# Patient Record
Sex: Female | Born: 1955 | Race: Black or African American | Hispanic: No | Smoking: Current every day smoker
Health system: Southern US, Community
[De-identification: ages and names within clinical notes are randomized; demographics above are authoritative.]

## PROBLEM LIST (undated history)

## (undated) DIAGNOSIS — K219 Gastro-esophageal reflux disease without esophagitis: Secondary | ICD-10-CM

## (undated) DIAGNOSIS — I10 Essential (primary) hypertension: Secondary | ICD-10-CM

## (undated) DIAGNOSIS — B1920 Unspecified viral hepatitis C without hepatic coma: Secondary | ICD-10-CM

## (undated) DIAGNOSIS — F419 Anxiety disorder, unspecified: Secondary | ICD-10-CM

## (undated) HISTORY — DX: Anxiety disorder, unspecified: F41.9

## (undated) HISTORY — DX: Gastro-esophageal reflux disease without esophagitis: K21.9

## (undated) HISTORY — DX: Unspecified viral hepatitis C without hepatic coma: B19.20

## (undated) HISTORY — PX: BREAST SURGERY: SHX581

## (undated) HISTORY — DX: Essential (primary) hypertension: I10

## (undated) HISTORY — PX: BREAST EXCISIONAL BIOPSY: SUR124

---

## 1999-09-30 ENCOUNTER — Ambulatory Visit (HOSPITAL_COMMUNITY): Admission: RE | Admit: 1999-09-30 | Discharge: 1999-09-30 | Payer: Self-pay | Admitting: *Deleted

## 1999-10-07 ENCOUNTER — Encounter: Admission: RE | Admit: 1999-10-07 | Discharge: 1999-10-07 | Payer: Self-pay | Admitting: *Deleted

## 2002-07-16 ENCOUNTER — Inpatient Hospital Stay (HOSPITAL_COMMUNITY): Admission: AC | Admit: 2002-07-16 | Discharge: 2002-07-16 | Payer: Self-pay

## 2004-06-11 ENCOUNTER — Emergency Department (HOSPITAL_COMMUNITY): Admission: EM | Admit: 2004-06-11 | Discharge: 2004-06-11 | Payer: Self-pay | Admitting: Emergency Medicine

## 2005-03-20 ENCOUNTER — Ambulatory Visit: Payer: Self-pay | Admitting: *Deleted

## 2005-03-20 ENCOUNTER — Ambulatory Visit (HOSPITAL_COMMUNITY): Admission: RE | Admit: 2005-03-20 | Discharge: 2005-03-20 | Payer: Self-pay | Admitting: Family Medicine

## 2005-03-20 ENCOUNTER — Ambulatory Visit: Payer: Self-pay | Admitting: Family Medicine

## 2005-04-02 ENCOUNTER — Ambulatory Visit (HOSPITAL_COMMUNITY): Admission: RE | Admit: 2005-04-02 | Discharge: 2005-04-02 | Payer: Self-pay | Admitting: Family Medicine

## 2005-04-09 ENCOUNTER — Ambulatory Visit: Payer: Self-pay | Admitting: Family Medicine

## 2006-05-14 ENCOUNTER — Ambulatory Visit: Payer: Self-pay | Admitting: Family Medicine

## 2006-10-16 ENCOUNTER — Encounter (INDEPENDENT_AMBULATORY_CARE_PROVIDER_SITE_OTHER): Payer: Self-pay | Admitting: Family Medicine

## 2006-10-16 DIAGNOSIS — F17200 Nicotine dependence, unspecified, uncomplicated: Secondary | ICD-10-CM | POA: Insufficient documentation

## 2008-04-17 ENCOUNTER — Ambulatory Visit: Payer: Self-pay | Admitting: Internal Medicine
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
## 2010-12-09 ENCOUNTER — Inpatient Hospital Stay (INDEPENDENT_AMBULATORY_CARE_PROVIDER_SITE_OTHER)
Admission: RE | Admit: 2010-12-09 | Discharge: 2010-12-09 | Disposition: A | Discharge status: Home / Self Care | Payer: Medicare Other | Source: Ambulatory Visit | Attending: Emergency Medicine | Admitting: Emergency Medicine

## 2010-12-09 DIAGNOSIS — H6020 Malignant otitis externa, unspecified ear: Secondary | ICD-10-CM

## 2011-09-19 ENCOUNTER — Other Ambulatory Visit (HOSPITAL_COMMUNITY): Payer: Self-pay | Admitting: Family Medicine

## 2011-09-19 DIAGNOSIS — Z1231 Encounter for screening mammogram for malignant neoplasm of breast: Secondary | ICD-10-CM

## 2011-10-02 ENCOUNTER — Ambulatory Visit (HOSPITAL_COMMUNITY)
Admission: RE | Admit: 2011-10-02 | Discharge: 2011-10-02 | Disposition: A | Discharge status: Home / Self Care | Payer: PRIVATE HEALTH INSURANCE | Source: Ambulatory Visit | Attending: Family Medicine | Admitting: Family Medicine

## 2011-10-02 DIAGNOSIS — Z1231 Encounter for screening mammogram for malignant neoplasm of breast: Secondary | ICD-10-CM | POA: Insufficient documentation

## 2013-02-16 ENCOUNTER — Other Ambulatory Visit (HOSPITAL_COMMUNITY): Payer: Self-pay | Admitting: Family Medicine

## 2013-02-16 DIAGNOSIS — Z1231 Encounter for screening mammogram for malignant neoplasm of breast: Secondary | ICD-10-CM

## 2013-03-01 ENCOUNTER — Ambulatory Visit (HOSPITAL_COMMUNITY)
Admission: RE | Admit: 2013-03-01 | Discharge: 2013-03-01 | Disposition: A | Discharge status: Home / Self Care | Payer: PRIVATE HEALTH INSURANCE | Source: Ambulatory Visit | Attending: Family Medicine | Admitting: Family Medicine

## 2013-03-01 DIAGNOSIS — Z1231 Encounter for screening mammogram for malignant neoplasm of breast: Secondary | ICD-10-CM

## 2014-01-23 ENCOUNTER — Other Ambulatory Visit (HOSPITAL_COMMUNITY): Payer: Self-pay | Admitting: Family Medicine

## 2014-01-23 DIAGNOSIS — Z1231 Encounter for screening mammogram for malignant neoplasm of breast: Secondary | ICD-10-CM

## 2014-03-01 DIAGNOSIS — I10 Essential (primary) hypertension: Secondary | ICD-10-CM | POA: Diagnosis not present

## 2014-03-02 ENCOUNTER — Ambulatory Visit (HOSPITAL_COMMUNITY)
Admission: RE | Admit: 2014-03-02 | Discharge: 2014-03-02 | Disposition: A | Discharge status: Home / Self Care | Payer: Medicare Other | Source: Ambulatory Visit | Attending: Family Medicine | Admitting: Family Medicine

## 2014-03-02 DIAGNOSIS — Z1231 Encounter for screening mammogram for malignant neoplasm of breast: Secondary | ICD-10-CM | POA: Diagnosis not present

## 2014-05-31 DIAGNOSIS — I10 Essential (primary) hypertension: Secondary | ICD-10-CM | POA: Diagnosis not present

## 2014-07-03 DIAGNOSIS — B170 Acute delta-(super) infection of hepatitis B carrier: Secondary | ICD-10-CM | POA: Diagnosis not present

## 2014-07-03 DIAGNOSIS — I10 Essential (primary) hypertension: Secondary | ICD-10-CM | POA: Diagnosis not present

## 2014-07-06 ENCOUNTER — Telehealth: Payer: Self-pay | Admitting: *Deleted

## 2014-07-06 NOTE — Telephone Encounter (Signed)
Left message for patient to call the clinic. She needs to be scheduled for Hep C labs. Myrtis Hopping

## 2014-07-18 ENCOUNTER — Other Ambulatory Visit: Payer: Medicare Other

## 2014-07-18 DIAGNOSIS — B182 Chronic viral hepatitis C: Secondary | ICD-10-CM | POA: Diagnosis not present

## 2014-07-18 LAB — CBC WITH DIFFERENTIAL/PLATELET
BASOS ABS: 0 10*3/uL (ref 0.0–0.1)
Basophils Relative: 1 % (ref 0–1)
EOS PCT: 3 % (ref 0–5)
Eosinophils Absolute: 0.1 10*3/uL (ref 0.0–0.7)
HCT: 45.5 % (ref 36.0–46.0)
Hemoglobin: 15.3 g/dL — ABNORMAL HIGH (ref 12.0–15.0)
Lymphocytes Relative: 43 % (ref 12–46)
Lymphs Abs: 1.8 10*3/uL (ref 0.7–4.0)
MCH: 28.1 pg (ref 26.0–34.0)
MCHC: 33.6 g/dL (ref 30.0–36.0)
MCV: 83.5 fL (ref 78.0–100.0)
MPV: 9.9 fL (ref 8.6–12.4)
Monocytes Absolute: 0.3 10*3/uL (ref 0.1–1.0)
Monocytes Relative: 6 % (ref 3–12)
NEUTROS ABS: 2 10*3/uL (ref 1.7–7.7)
Neutrophils Relative %: 47 % (ref 43–77)
Platelets: 208 10*3/uL (ref 150–400)
RBC: 5.45 MIL/uL — AB (ref 3.87–5.11)
RDW: 14 % (ref 11.5–15.5)
WBC: 4.2 10*3/uL (ref 4.0–10.5)

## 2014-07-18 LAB — COMPREHENSIVE METABOLIC PANEL
ALBUMIN: 3.8 g/dL (ref 3.5–5.2)
ALK PHOS: 89 U/L (ref 39–117)
ALT: 36 U/L — AB (ref 0–35)
AST: 30 U/L (ref 0–37)
BILIRUBIN TOTAL: 0.5 mg/dL (ref 0.2–1.2)
BUN: 18 mg/dL (ref 6–23)
CALCIUM: 9.4 mg/dL (ref 8.4–10.5)
CHLORIDE: 103 meq/L (ref 96–112)
CO2: 30 mEq/L (ref 19–32)
CREATININE: 0.67 mg/dL (ref 0.50–1.10)
Glucose, Bld: 95 mg/dL (ref 70–99)
POTASSIUM: 4.1 meq/L (ref 3.5–5.3)
SODIUM: 139 meq/L (ref 135–145)
Total Protein: 7 g/dL (ref 6.0–8.3)

## 2014-07-18 LAB — IRON: Iron: 122 ug/dL (ref 42–145)

## 2014-07-19 LAB — PROTIME-INR
INR: 1.01 (ref <1.50)
Prothrombin Time: 13.3 seconds (ref 11.6–15.2)

## 2014-07-19 LAB — HEPATITIS B SURFACE ANTIBODY,QUALITATIVE: HEP B S AB: NEGATIVE

## 2014-07-19 LAB — HEPATITIS A ANTIBODY, TOTAL: Hep A Total Ab: NONREACTIVE

## 2014-07-19 LAB — ANA: ANA: NEGATIVE

## 2014-07-19 LAB — HEPATITIS B CORE ANTIBODY, TOTAL: Hep B Core Total Ab: NONREACTIVE

## 2014-07-19 LAB — HIV ANTIBODY (ROUTINE TESTING W REFLEX): HIV 1&2 Ab, 4th Generation: NONREACTIVE

## 2014-07-19 LAB — HEPATITIS C RNA QUANTITATIVE
HCV Quantitative Log: 6.13 {Log} — ABNORMAL HIGH (ref <1.18)
HCV Quantitative: 1335396 IU/mL — ABNORMAL HIGH (ref <15)

## 2014-07-19 LAB — HEPATITIS B SURFACE ANTIGEN: Hepatitis B Surface Ag: NEGATIVE

## 2014-07-21 LAB — HEPATITIS C GENOTYPE

## 2014-07-26 ENCOUNTER — Telehealth: Payer: Self-pay | Admitting: *Deleted

## 2014-07-26 NOTE — Telephone Encounter (Signed)
Patient called asking if she was going to be scheduled for follow up after her labs. RN advised that Kennyth Lose or Maudie Mercury would be contacting her soon for a Hep C appointment.  Patient confirmed her home number, updated her cell number.  It is ok to leave a message. Landis Gandy, RN

## 2014-08-23 ENCOUNTER — Ambulatory Visit (INDEPENDENT_AMBULATORY_CARE_PROVIDER_SITE_OTHER): Payer: Medicare Other | Admitting: Internal Medicine

## 2014-08-23 ENCOUNTER — Encounter: Payer: Self-pay | Admitting: Internal Medicine

## 2014-08-23 VITALS — BP 145/89 | HR 62 | Temp 98.1°F | Ht 67.0 in | Wt 161.0 lb

## 2014-08-23 DIAGNOSIS — B182 Chronic viral hepatitis C: Secondary | ICD-10-CM | POA: Diagnosis not present

## 2014-08-23 DIAGNOSIS — Z23 Encounter for immunization: Secondary | ICD-10-CM

## 2014-08-23 MED ORDER — LEDIPASVIR-SOFOSBUVIR 90-400 MG PO TABS: 1.0000 | ORAL_TABLET | Freq: Every day | ORAL | Status: DC

## 2014-08-23 NOTE — Addendum Note (Signed)
Addended by: Myrtis Hopping A on: 08/23/2014 03:25 PM   Modules accepted: Orders

## 2014-08-23 NOTE — Progress Notes (Signed)
+Martha Novak is a 59 y.o. female who presents for initial evaluation and management of a positive Hepatitis C antibody test.  Patient tested positive 5 years ago. Hepatitis C risk factors present are: IV drug abuse (details: more than 20 years ago). Patient denies history of blood transfusion, history of clotting factor transfusion, intranasal drug use, multiple sexual partners, renal dialysis, sexual contact with person with liver disease, tattoos. Patient has had other studies performed. Results: hepatitis C RNA by PCR, result: positive. Patient has not had prior treatment for Hepatitis C. Patient does not have a past history of liver disease. Patient does not have a family history of liver disease.   HPI: Remote drug use, last alcohol was more than 4 years ago.  Takes occasional ppi but ok to not take it during treatment.    Patient does not have documented immunity to Hepatitis A. Patient does not have documented immunity to Hepatitis B.     Review of Systems A comprehensive review of systems was negative.   No past medical history on file.  Prior to Admission medications   Medication Sig Start Date End Date Taking? Authorizing Provider  hydrochlorothiazide (HYDRODIURIL) 25 MG tablet Take 25 mg by mouth daily.   Yes Historical Provider, MD  lisinopril (PRINIVIL,ZESTRIL) 20 MG tablet Take 20 mg by mouth daily.   Yes Historical Provider, MD  Ledipasvir-Sofosbuvir (HARVONI) 90-400 MG TABS Take 1 tablet by mouth daily. 08/23/14   Thayer Headings, MD    No Known Allergies  History  Substance Use Topics  . Smoking status: Current Every Day Smoker -- 1.00 packs/day    Types: Cigarettes    Start date: 02/10/1969  . Smokeless tobacco: Never Used  . Alcohol Use: No    No family history on file.    Objective:   Filed Vitals:   08/23/14 1443  BP: 145/89  Pulse: 62  Temp: 98.1 F (36.7 C)   in no apparent distress and alert HEENT: anicteric Cor RRR and No murmurs clear Bowel  sounds are normal, liver is not enlarged, spleen is not enlarged peripheral pulses normal, no pedal edema, no clubbing or cyanosis negative for - jaundice, spider hemangioma, telangiectasia, palmar erythema, ecchymosis and atrophy  Laboratory Genotype:  Lab Results  Component Value Date   HCVGENOTYPE 1a 07/18/2014   HCV viral load:  Lab Results  Component Value Date   HCVQUANT [id number]* 07/18/2014   Lab Results  Component Value Date   WBC 4.2 07/18/2014   HGB 15.3* 07/18/2014   HCT 45.5 07/18/2014   MCV 83.5 07/18/2014   PLT 208 07/18/2014    Lab Results  Component Value Date   CREATININE 0.67 07/18/2014   BUN 18 07/18/2014   NA 139 07/18/2014   K 4.1 07/18/2014   CL 103 07/18/2014   CO2 30 07/18/2014    Lab Results  Component Value Date   ALT 36* 07/18/2014   AST 30 07/18/2014   ALKPHOS 89 07/18/2014   BILITOT 0.5 07/18/2014   INR 1.01 07/18/2014      Assessment: Chronic Hepatitis C genotype 1a  Plan: 1) Patient counseled extensively on limiting acetaminophen to no more than 2 grams daily, avoidance of alcohol. 2) Transmission discussed with patient including sexual transmission, sharing razors and toothbrush.   3) Will need referral to gastroenterology if concern for cirrhosis 4) Will need referral for substance abuse counseling: No. 5) Will prescribe Harvoni for 12 weeks now 6) Hepatitis A vaccine Yes.   7) Hepatitis B  vaccine Yes.   8) Pneumovax vaccine if concern for cirrhosis 9) will follow up after elastography

## 2014-08-23 NOTE — Patient Instructions (Signed)
Date 08/23/2014  Dear Ms Martha Novak, As discussed in the [facility], your hepatitis C therapy will include the following medications:          Harvoni 90mg /400mg  tablet:           Take 1 tablet by mouth once daily   Please note that ALL MEDICATIONS WILL START ON THE SAME DATE for a total of 12 weeks. ---------------------------------------------------------------- Your HCV Treatment Start Date: TBA   Your HCV genotype:  1a    Liver Fibrosis: TBD    ---------------------------------------------------------------- YOUR PHARMACY CONTACT:   Martha Novak [facility] [place] Phone: [phone] Hours: Monday to Friday 7:30 am to 6:00 pm   Please always contact your pharmacy at least 3-4 business days before you run out of medications to ensure your next month's medication is ready or 1 week prior to running out if you receive it by mail.  Remember, each prescription is for 28 days. ---------------------------------------------------------------- GENERAL NOTES REGARDING YOUR HEPATITIS C MEDICATION:  SOFOSBUVIR/LEDIPASVIR (HARVONI): - Harvoni tablet is taken daily with OR without food. - The tablets are orange. - The tablets should be stored at room temperature.  - Acid reducing agents such as H2 blockers (ie. Pepcid (famotidine), Zantac (ranitidine), Tagamet (cimetidine), Axid (nizatidine) and proton pump inhibitors (ie. Prilosec (omeprazole), Protonix (pantoprazole), Nexium (esomeprazole), or Aciphex (rabeprazole)) can decrease effectiveness of Harvoni. Do not take until you have discussed with a health care provider.    -Antacids that contain magnesium and/or aluminum hydroxide (ie. Milk of Magensia, Rolaids, Gaviscon, Maalox, Mylanta, an dArthritis Pain Formula)can reduce absorption of Harvoni, so take them at least 4 hours before or after Harvoni.  -Calcium carbonate (calcium supplements or antacids such as Tums, Caltrate,  Os-Cal)needs to be taken at least 4 hours hours before or after Harvoni.  -Martha Novak or any products that contain Martha Novak like some herbal supplements  Please inform the office prior to starting any of these medications.  - The common side effects with Harvoni:      1. Fatigue      2. Headache      3. Nausea      4. Diarrhea      5. Insomnia   Support Path is a suite of resources designed to help patients start with HARVONI and move toward treatment completion Martha Novak helps patients access therapy and get off to an efficient start  Benefits investigation and prior authorization support Co-pay and other financial assistance A specialty pharmacy finder CO-PAY COUPON The [place] co-pay coupon may help eligible patients lower their out-of-pocket costs. With a co-pay coupon, most eligible patients may pay no more than $5 per co-pay (restrictions apply) www.harvoni.com call 5851421161 Not valid for patients enrolled in government healthcare prescription drug programs, such as Medicare Part D and Medicaid. Patients in the coverage gap known as the "donut hole" also are not eligible The HARVONI co-pay coupon program will cover the out-of-pocket costs for HARVONI prescriptions up to a maximum of 25% of the catalog price of a 12-week regimen of HARVONI  Please note that this only lists the most common side effects and is NOT a comprehensive list of the potential side effects of these medications. For more information, please review the drug information sheets that come with your medication package from the pharmacy.  ---------------------------------------------------------------- GENERAL HELPFUL HINTS ON HCV THERAPY: 1. No alcohol. 2. Protect against sun-sensitivity/sunburns (wear sunglasses, hat, long sleeves, pants  and sunscreen). 3. Stay well-hydrated/well-moisturized. 4. Notify the [facility] of any changes in your other over-the-counter/herbal or  prescription medications. 5. If you miss a dose of your medication, take the missed dose as soon as you remember. Return to your regular time/dose schedule the next day.  6.  Do not stop taking your medications without first talking with your healthcare provider. 7.  You may take Tylenol (acetaminophen), as long as the dose is less than 2000 mg (OR no more than 4 tablets of the Tylenol Extra Strengths '500mg'$  tablet) in 24 hours. 8.  You will need to obtain routine labs and/or office visits at [facility] at weeks 4 and 12 as well as 12 and 24 weeks after completion of treatment.   Martha Novak, [place] for [place] [address] [place], [place]  [id number] [phone]

## 2014-09-11 DIAGNOSIS — I10 Essential (primary) hypertension: Secondary | ICD-10-CM | POA: Diagnosis not present

## 2014-09-11 DIAGNOSIS — R001 Bradycardia, unspecified: Secondary | ICD-10-CM | POA: Diagnosis not present

## 2014-09-21 ENCOUNTER — Ambulatory Visit (HOSPITAL_COMMUNITY)
Admission: RE | Admit: 2014-09-21 | Discharge: 2014-09-21 | Disposition: A | Discharge status: Home / Self Care | Payer: Medicare Other | Source: Ambulatory Visit | Attending: Internal Medicine | Admitting: Internal Medicine

## 2014-09-21 DIAGNOSIS — B182 Chronic viral hepatitis C: Secondary | ICD-10-CM | POA: Diagnosis not present

## 2014-09-21 DIAGNOSIS — B1920 Unspecified viral hepatitis C without hepatic coma: Secondary | ICD-10-CM | POA: Diagnosis not present

## 2014-09-21 DIAGNOSIS — F17210 Nicotine dependence, cigarettes, uncomplicated: Secondary | ICD-10-CM | POA: Insufficient documentation

## 2014-10-03 ENCOUNTER — Encounter (HOSPITAL_COMMUNITY): Payer: Self-pay | Admitting: Pharmacy Technician

## 2014-10-03 ENCOUNTER — Ambulatory Visit (INDEPENDENT_AMBULATORY_CARE_PROVIDER_SITE_OTHER): Payer: Medicare Other | Admitting: Internal Medicine

## 2014-10-03 ENCOUNTER — Encounter: Payer: Self-pay | Admitting: Internal Medicine

## 2014-10-03 VITALS — BP 109/70 | HR 71 | Temp 98.0°F | Ht 67.0 in | Wt 152.0 lb

## 2014-10-03 DIAGNOSIS — K7400 Hepatic fibrosis, unspecified: Secondary | ICD-10-CM | POA: Insufficient documentation

## 2014-10-03 DIAGNOSIS — Z23 Encounter for immunization: Secondary | ICD-10-CM

## 2014-10-03 DIAGNOSIS — B182 Chronic viral hepatitis C: Secondary | ICD-10-CM

## 2014-10-03 NOTE — Progress Notes (Signed)
   Subjective:    Patient ID: Martha Novak, female    DOB: 22-Aug-1955, 59 y.o.   MRN: [id number]  HPI Here for follow up of hepatitis C.  Genotype 1a, viral load 1.3 milion, elastography of F2/3.  Hepatitis A and B non immune, on vaccine series.  Started harvoni on 8/17.  No fatigue, no headaches.  Tolerating medication.  Pleased with treatment.    Review of Systems  Constitutional: Negative for fatigue and unexpected weight change.  Gastrointestinal: Negative for nausea and diarrhea.  Skin: Negative for rash.  Neurological: Negative for dizziness and light-headedness.       Objective:   Physical Exam  Constitutional: She appears well-developed and well-nourished. No distress.  HENT:  Mouth/Throat: No oropharyngeal exudate.  Eyes: No scleral icterus.  Cardiovascular: Normal rate, regular rhythm and normal heart sounds.   No murmur heard. Pulmonary/Chest: Effort normal and breath sounds normal. No respiratory distress. She has no wheezes.  Lymphadenopathy:    She has no cervical adenopathy.  Skin: No rash noted.          Assessment & Plan:

## 2014-10-03 NOTE — Addendum Note (Signed)
Addended by: Myrtis Hopping A on: 10/03/2014 04:13 PM   Modules accepted: Orders

## 2014-10-03 NOTE — Assessment & Plan Note (Signed)
Repeat scan in 1 year

## 2014-10-03 NOTE — Assessment & Plan Note (Signed)
Doing good on medication.  Labs in 3 weeks and follow up in 4 weeks.  Repeat elastography next year.

## 2014-10-24 ENCOUNTER — Other Ambulatory Visit: Payer: Medicare Other

## 2014-10-31 ENCOUNTER — Ambulatory Visit (INDEPENDENT_AMBULATORY_CARE_PROVIDER_SITE_OTHER): Payer: Medicare Other | Admitting: Internal Medicine

## 2014-10-31 ENCOUNTER — Encounter: Payer: Self-pay | Admitting: Internal Medicine

## 2014-10-31 VITALS — BP 147/98 | HR 57 | Temp 97.7°F | Wt 156.0 lb

## 2014-10-31 DIAGNOSIS — Z23 Encounter for immunization: Secondary | ICD-10-CM | POA: Diagnosis not present

## 2014-10-31 DIAGNOSIS — B182 Chronic viral hepatitis C: Secondary | ICD-10-CM | POA: Diagnosis not present

## 2014-10-31 LAB — COMPLETE METABOLIC PANEL WITH GFR
ALK PHOS: 75 U/L (ref 33–130)
ALT: 19 U/L (ref 6–29)
AST: 16 U/L (ref 10–35)
Albumin: 4.2 g/dL (ref 3.6–5.1)
BUN: 7 mg/dL (ref 7–25)
CHLORIDE: 103 mmol/L (ref 98–110)
CO2: 31 mmol/L (ref 20–31)
Calcium: 9.2 mg/dL (ref 8.6–10.4)
Creat: 0.66 mg/dL (ref 0.50–1.05)
GFR, Est African American: >89 mL/min (ref >=60)
GLUCOSE: 71 mg/dL (ref 65–99)
POTASSIUM: 4 mmol/L (ref 3.5–5.3)
SODIUM: 141 mmol/L (ref 135–146)
Total Bilirubin: 0.4 mg/dL (ref 0.2–1.2)
Total Protein: 7 g/dL (ref 6.1–8.1)

## 2014-10-31 LAB — CBC WITH DIFFERENTIAL/PLATELET
BASOS PCT: 1 % (ref 0–1)
Basophils Absolute: 0 10*3/uL (ref 0.0–0.1)
EOS ABS: 0.2 10*3/uL (ref 0.0–0.7)
Eosinophils Relative: 4 % (ref 0–5)
HCT: 43.1 % (ref 36.0–46.0)
Hemoglobin: 14.8 g/dL (ref 12.0–15.0)
Lymphocytes Relative: 55 % — ABNORMAL HIGH (ref 12–46)
Lymphs Abs: 2.4 10*3/uL (ref 0.7–4.0)
MCH: 28.2 pg (ref 26.0–34.0)
MCHC: 34.3 g/dL (ref 30.0–36.0)
MCV: 82.3 fL (ref 78.0–100.0)
MONO ABS: 0.2 10*3/uL (ref 0.1–1.0)
MONOS PCT: 5 % (ref 3–12)
MPV: 9.9 fL (ref 8.6–12.4)
NEUTROS PCT: 35 % — AB (ref 43–77)
Neutro Abs: 1.5 10*3/uL — ABNORMAL LOW (ref 1.7–7.7)
PLATELETS: 176 10*3/uL (ref 150–400)
RBC: 5.24 MIL/uL — AB (ref 3.87–5.11)
RDW: 13.9 % (ref 11.5–15.5)
WBC: 4.3 10*3/uL (ref 4.0–10.5)

## 2014-10-31 NOTE — Progress Notes (Signed)
   Subjective:    Patient ID: Army Fossa, female    DOB: 06/26/1955, 59 y.o.   MRN: [id number]  HPI  Here for follow up of HCV.  On harvoni and now about done with first bottle.  No issues with headache, fatigue.  Pleased with the regimen. No missed doses.  Getting hepatitis A and B vaccine, last due February 2017.  F2/3 on elastography.    Review of Systems  Constitutional: Negative for fatigue.  Gastrointestinal: Negative for nausea and diarrhea.  Skin: Negative for rash.  Neurological: Negative for headaches.       Objective:   Physical Exam  Constitutional: She appears well-developed and well-nourished. No distress.  Eyes: No scleral icterus.  Cardiovascular: Normal rate, regular rhythm and normal heart sounds.   No murmur heard. Pulmonary/Chest: Effort normal and breath sounds normal. No respiratory distress.  Skin: No rash noted.          Assessment & Plan:

## 2014-10-31 NOTE — Assessment & Plan Note (Signed)
Doing well.  Labs today and rtc after treatment completion.

## 2014-11-01 LAB — HEPATITIS C RNA QUANTITATIVE: HCV QUANT: NOT DETECTED [IU]/mL (ref <15)

## 2014-12-06 DIAGNOSIS — B182 Chronic viral hepatitis C: Secondary | ICD-10-CM | POA: Diagnosis not present

## 2014-12-06 DIAGNOSIS — Z1211 Encounter for screening for malignant neoplasm of colon: Secondary | ICD-10-CM | POA: Diagnosis not present

## 2014-12-06 DIAGNOSIS — K219 Gastro-esophageal reflux disease without esophagitis: Secondary | ICD-10-CM | POA: Diagnosis not present

## 2014-12-06 DIAGNOSIS — R079 Chest pain, unspecified: Secondary | ICD-10-CM | POA: Diagnosis not present

## 2014-12-11 DIAGNOSIS — I10 Essential (primary) hypertension: Secondary | ICD-10-CM | POA: Diagnosis not present

## 2014-12-18 DIAGNOSIS — Z1211 Encounter for screening for malignant neoplasm of colon: Secondary | ICD-10-CM | POA: Diagnosis not present

## 2015-01-08 ENCOUNTER — Encounter: Payer: Self-pay | Admitting: Internal Medicine

## 2015-01-08 ENCOUNTER — Ambulatory Visit (INDEPENDENT_AMBULATORY_CARE_PROVIDER_SITE_OTHER): Payer: Medicare Other | Admitting: Internal Medicine

## 2015-01-08 VITALS — BP 140/87 | HR 60 | Temp 98.0°F | Ht 67.0 in | Wt 160.0 lb

## 2015-01-08 DIAGNOSIS — K7400 Hepatic fibrosis, unspecified: Secondary | ICD-10-CM

## 2015-01-08 DIAGNOSIS — F17200 Nicotine dependence, unspecified, uncomplicated: Secondary | ICD-10-CM

## 2015-01-08 DIAGNOSIS — B182 Chronic viral hepatitis C: Secondary | ICD-10-CM

## 2015-01-08 NOTE — Assessment & Plan Note (Signed)
Will recheck elastography in about 6-10 months.

## 2015-01-08 NOTE — Progress Notes (Signed)
   Subjective:    Patient ID: Martha Novak, female    DOB: 1955/11/11, 59 y.o.   MRN: [id number]  HPI  Here for follow up of HCV.  On harvoni for genotype 1 and now about done with treatment with about 7 pills left.  No issues with headache, fatigue.  Pleased with the regimen. No missed doses, though I believe should be done.  Getting hepatitis A and B vaccine, last due February 2017.  F2/3 on elastography.  No new complaints.    Review of Systems  Constitutional: Negative for fatigue.  Gastrointestinal: Negative for nausea and diarrhea.  Skin: Negative for rash.  Neurological: Negative for headaches.       Objective:   Physical Exam  Constitutional: She appears well-developed and well-nourished. No distress.  Eyes: No scleral icterus.  Cardiovascular: Normal rate, regular rhythm and normal heart sounds.   No murmur heard. Pulmonary/Chest: Effort normal and breath sounds normal. No respiratory distress.  Skin: No rash noted.  Vitals reviewed.  Social History   Social History  . Marital Status: Legally Separated    Spouse Name: N/A  . Number of Children: N/A  . Years of Education: N/A   Occupational History  . Not on file.   Social History Main Topics  . Smoking status: Current Every Day Smoker -- 1.00 packs/day    Types: Cigarettes    Start date: 02/10/1969  . Smokeless tobacco: Never Used     Comment: pt not currently ready to quit  . Alcohol Use: No  . Drug Use: No  . Sexual Activity: Not on file   Other Topics Concern  . Not on file   Social History Narrative   Confirmed again that she does not drink.       Assessment & Plan:

## 2015-01-08 NOTE — Assessment & Plan Note (Signed)
Encouraged cessation.

## 2015-01-08 NOTE — Assessment & Plan Note (Signed)
Will schedule her for the after treatment lab in about 2 weeks.  Will let her know of results and rtc 4 months for SVR12.

## 2015-01-22 ENCOUNTER — Other Ambulatory Visit: Payer: Medicare Other

## 2015-01-22 DIAGNOSIS — B182 Chronic viral hepatitis C: Secondary | ICD-10-CM | POA: Diagnosis not present

## 2015-01-23 DIAGNOSIS — Z23 Encounter for immunization: Secondary | ICD-10-CM | POA: Diagnosis not present

## 2015-01-23 DIAGNOSIS — I10 Essential (primary) hypertension: Secondary | ICD-10-CM | POA: Diagnosis not present

## 2015-01-23 DIAGNOSIS — Z6825 Body mass index (BMI) 25.0-25.9, adult: Secondary | ICD-10-CM | POA: Diagnosis not present

## 2015-01-23 LAB — COMPLETE METABOLIC PANEL WITH GFR
ALBUMIN: 3.9 g/dL (ref 3.6–5.1)
ALT: 23 U/L (ref 6–29)
AST: 20 U/L (ref 10–35)
Alkaline Phosphatase: 61 U/L (ref 33–130)
BUN: 7 mg/dL (ref 7–25)
CALCIUM: 8.9 mg/dL (ref 8.6–10.4)
CHLORIDE: 111 mmol/L — AB (ref 98–110)
CO2: 26 mmol/L (ref 20–31)
CREATININE: 0.75 mg/dL (ref 0.50–1.05)
GFR, Est African American: >89 mL/min (ref >=60)
GFR, Est Non African American: 88 mL/min (ref >=60)
GLUCOSE: 100 mg/dL — AB (ref 65–99)
Potassium: 4.1 mmol/L (ref 3.5–5.3)
SODIUM: 141 mmol/L (ref 135–146)
Total Bilirubin: 0.5 mg/dL (ref 0.2–1.2)
Total Protein: 6.7 g/dL (ref 6.1–8.1)

## 2015-01-23 LAB — CBC WITH DIFFERENTIAL/PLATELET
BASOS PCT: 0 % (ref 0–1)
Basophils Absolute: 0 10*3/uL (ref 0.0–0.1)
EOS ABS: 0.1 10*3/uL (ref 0.0–0.7)
Eosinophils Relative: 3 % (ref 0–5)
HCT: 42.7 % (ref 36.0–46.0)
HEMOGLOBIN: 14.1 g/dL (ref 12.0–15.0)
Lymphocytes Relative: 47 % — ABNORMAL HIGH (ref 12–46)
Lymphs Abs: 2.2 10*3/uL (ref 0.7–4.0)
MCH: 27.8 pg (ref 26.0–34.0)
MCHC: 33 g/dL (ref 30.0–36.0)
MCV: 84.1 fL (ref 78.0–100.0)
MONO ABS: 0.2 10*3/uL (ref 0.1–1.0)
MONOS PCT: 5 % (ref 3–12)
MPV: 9.9 fL (ref 8.6–12.4)
Neutro Abs: 2.1 10*3/uL (ref 1.7–7.7)
Neutrophils Relative %: 45 % (ref 43–77)
PLATELETS: 191 10*3/uL (ref 150–400)
RBC: 5.08 MIL/uL (ref 3.87–5.11)
RDW: 14.3 % (ref 11.5–15.5)
WBC: 4.7 10*3/uL (ref 4.0–10.5)

## 2015-01-24 LAB — HEPATITIS C RNA QUANTITATIVE: HCV QUANT: NOT DETECTED [IU]/mL (ref <15)

## 2015-01-25 ENCOUNTER — Telehealth: Payer: Self-pay | Admitting: *Deleted

## 2015-01-25 NOTE — Telephone Encounter (Signed)
-----   Message from Thayer Headings, MD sent at 01/24/2015  1:20 PM EST ----- Please let her know her HCV has remained undetectable after treatment.  We will recheck again at her next visit in April. thanks

## 2015-01-25 NOTE — Telephone Encounter (Signed)
Relayed results to patient. Maricarmen Braziel M, RN  

## 2015-02-16 ENCOUNTER — Ambulatory Visit (INDEPENDENT_AMBULATORY_CARE_PROVIDER_SITE_OTHER): Payer: Medicare Other | Admitting: Family Medicine

## 2015-02-16 ENCOUNTER — Ambulatory Visit (INDEPENDENT_AMBULATORY_CARE_PROVIDER_SITE_OTHER): Payer: Medicare Other

## 2015-02-16 VITALS — BP 132/88 | HR 64 | Temp 98.1°F | Resp 20 | Ht 68.0 in | Wt 157.2 lb

## 2015-02-16 DIAGNOSIS — S8991XA Unspecified injury of right lower leg, initial encounter: Secondary | ICD-10-CM

## 2015-02-16 DIAGNOSIS — M25461 Effusion, right knee: Secondary | ICD-10-CM

## 2015-02-16 DIAGNOSIS — M25561 Pain in right knee: Secondary | ICD-10-CM

## 2015-02-16 MED ORDER — TRAMADOL HCL 50 MG PO TABS: 50.0000 mg | ORAL_TABLET | Freq: Three times a day (TID) | ORAL | Status: AC | PRN

## 2015-02-16 MED ORDER — MELOXICAM 15 MG PO TABS: 15.0000 mg | ORAL_TABLET | Freq: Every day | ORAL | Status: AC

## 2015-02-16 NOTE — Progress Notes (Signed)
   Martha Novak  MRN: [id number] DOB: 1955-09-04  Subjective:  Pt presents to clinic with right knee pain.  She twisted her knee 2 days ago when she stepped off a curb and twisted her knee when she turned abruptly.  Immediate swelling and pain.  She has been using some ice and warm compresses.  Wakes her up at night esp when she tries and turns over.  She has had problems with knee pain in the past but this is different.    Patient Active Problem List   Diagnosis Date Noted  . Liver fibrosis ([place]) 10/03/2014  . Chronic hepatitis C without hepatic coma ([place]) 08/23/2014  . TOBACCO USER 10/16/2006    Current Outpatient Prescriptions on File Prior to Visit  Medication Sig Dispense Refill  . famotidine (PEPCID) 20 MG tablet TAKE 1 TABLET BY MOUTH EVERY DAY FOR GERD  3  . hydrochlorothiazide (HYDRODIURIL) 25 MG tablet Take 25 mg by mouth daily.    Marland Kitchen lisinopril (PRINIVIL,ZESTRIL) 20 MG tablet Take 20 mg by mouth daily.     No current facility-administered medications on file prior to visit.    No Known Allergies  Review of Systems  Musculoskeletal: Positive for gait problem (2nd to pain).   Objective:  BP 132/88 mmHg  Pulse 64  Temp(Src) 98.1 F (36.7 C) (Oral)  Resp 20  Ht 5\' 8"  (1.727 m)  Wt 157 lb 3.2 oz (71.305 kg)  BMI 23.91 kg/m2  SpO2 98%  Physical Exam  Constitutional: She is oriented to person, place, and time and well-developed, well-nourished, and in no distress.  HENT:  Head: Normocephalic and atraumatic.  Right Ear: Hearing and external ear normal.  Left Ear: Hearing and external ear normal.  Eyes: Conjunctivae are normal.  Neck: Normal range of motion.  Pulmonary/Chest: Effort normal.  Musculoskeletal:       Right knee: She exhibits decreased range of motion (about 10degrees lack of full extension and about 20 degree lack in full flexion), swelling, effusion and laceration (small abrasion). She exhibits no ecchymosis. Tenderness found. Lateral joint line  (mild) tenderness noted. No medial joint line, no MCL and no LCL tenderness noted.       Left knee: Normal.  Unable to do a good exam due to patient discomfort  Neurological: She is alert and oriented to person, place, and time. Gait normal.  Skin: Skin is warm and dry.  Psychiatric: Mood, memory, affect and judgment normal.  Vitals reviewed.  [facility] reading (PRIMARY) by  Dr. Carlota Raspberry - Lateral patella tilt, slight effusion and mild degenerative changes and calcification of patella tendon  Assessment and Plan :  Knee pain, right - Plan: DG Knee Complete 4 Views Right, meloxicam (MOBIC) 15 MG tablet, traMADol (ULTRAM) 50 MG tablet  Knee effusion, right - Plan: Apply ace wrap  Knee injury, right, initial encounter   Pt has some degenerative changes that could have been irritated with her injury but it is also possible she has a meniscal tear but with the hard exam we will allow time to decrease the inflammation and swelling.  An ace wrap was applied to the knee and the patient was instructed to ice and elevation and use medication as needed.  D/w Dr Myriam Forehand PA-C  [facility] 02/16/2015 3:52 PM

## 2015-02-16 NOTE — Patient Instructions (Signed)
Ice and elevation to the knee   Recheck in a week if you are not better   Take Mobic daily and then ultram as needed for the pain.

## 2015-02-18 NOTE — Progress Notes (Signed)
Xray read and patient discussed with Martha Novak. Agree with assessment and plan of care per her note.   

## 2015-02-21 ENCOUNTER — Other Ambulatory Visit: Payer: Self-pay

## 2015-02-21 DIAGNOSIS — M25561 Pain in right knee: Secondary | ICD-10-CM

## 2015-02-21 DIAGNOSIS — Z1231 Encounter for screening mammogram for malignant neoplasm of breast: Secondary | ICD-10-CM

## 2015-02-21 NOTE — Telephone Encounter (Signed)
[facility] faxed req for 90 day RFs of meloxicam. Judson Roch, do you want to give pt RFs? If so, please sign w/Dr Smith's name since [facility] requires MD. Thanks!

## 2015-02-22 NOTE — Telephone Encounter (Signed)
This is an acute injury - I am not sure why she wants to have a 90 d supply. - she should be able to get a 30d supply locally but it looks like I sent [facility] by mistake - can we call the patient and see which local pharmacy she wants to use and we will send it there

## 2015-02-26 ENCOUNTER — Other Ambulatory Visit: Payer: Self-pay | Admitting: Physician Assistant

## 2015-03-02 ENCOUNTER — Emergency Department (HOSPITAL_COMMUNITY): Payer: Medicare Other

## 2015-03-02 ENCOUNTER — Encounter (HOSPITAL_COMMUNITY): Payer: Self-pay | Admitting: Emergency Medicine

## 2015-03-02 ENCOUNTER — Emergency Department (HOSPITAL_COMMUNITY)
Admission: EM | Admit: 2015-03-02 | Discharge: 2015-03-02 | Disposition: A | Discharge status: Home / Self Care | Payer: Medicare Other | Attending: Emergency Medicine | Admitting: Emergency Medicine

## 2015-03-02 DIAGNOSIS — Z79899 Other long term (current) drug therapy: Secondary | ICD-10-CM | POA: Diagnosis not present

## 2015-03-02 DIAGNOSIS — W19XXXA Unspecified fall, initial encounter: Secondary | ICD-10-CM

## 2015-03-02 DIAGNOSIS — Z791 Long term (current) use of non-steroidal anti-inflammatories (NSAID): Secondary | ICD-10-CM | POA: Diagnosis not present

## 2015-03-02 DIAGNOSIS — M25511 Pain in right shoulder: Secondary | ICD-10-CM | POA: Diagnosis not present

## 2015-03-02 DIAGNOSIS — F17210 Nicotine dependence, cigarettes, uncomplicated: Secondary | ICD-10-CM | POA: Diagnosis not present

## 2015-03-02 DIAGNOSIS — S52592A Other fractures of lower end of left radius, initial encounter for closed fracture: Secondary | ICD-10-CM | POA: Diagnosis not present

## 2015-03-02 DIAGNOSIS — S4991XA Unspecified injury of right shoulder and upper arm, initial encounter: Secondary | ICD-10-CM | POA: Diagnosis not present

## 2015-03-02 DIAGNOSIS — S52611A Displaced fracture of right ulna styloid process, initial encounter for closed fracture: Secondary | ICD-10-CM | POA: Diagnosis not present

## 2015-03-02 DIAGNOSIS — Y998 Other external cause status: Secondary | ICD-10-CM | POA: Insufficient documentation

## 2015-03-02 DIAGNOSIS — S52591A Other fractures of lower end of right radius, initial encounter for closed fracture: Secondary | ICD-10-CM | POA: Diagnosis not present

## 2015-03-02 DIAGNOSIS — Y9389 Activity, other specified: Secondary | ICD-10-CM | POA: Insufficient documentation

## 2015-03-02 DIAGNOSIS — S42291A Other displaced fracture of upper end of right humerus, initial encounter for closed fracture: Secondary | ICD-10-CM | POA: Diagnosis not present

## 2015-03-02 DIAGNOSIS — M79603 Pain in arm, unspecified: Secondary | ICD-10-CM | POA: Diagnosis not present

## 2015-03-02 DIAGNOSIS — Y9289 Other specified places as the place of occurrence of the external cause: Secondary | ICD-10-CM | POA: Insufficient documentation

## 2015-03-02 DIAGNOSIS — W010XXA Fall on same level from slipping, tripping and stumbling without subsequent striking against object, initial encounter: Secondary | ICD-10-CM | POA: Diagnosis not present

## 2015-03-02 DIAGNOSIS — T148 Other injury of unspecified body region: Secondary | ICD-10-CM | POA: Diagnosis not present

## 2015-03-02 DIAGNOSIS — S6991XA Unspecified injury of right wrist, hand and finger(s), initial encounter: Secondary | ICD-10-CM | POA: Diagnosis present

## 2015-03-02 DIAGNOSIS — M25521 Pain in right elbow: Secondary | ICD-10-CM | POA: Diagnosis not present

## 2015-03-02 MED ORDER — HYDROMORPHONE HCL 1 MG/ML IJ SOLN
1.0000 mg | Freq: Once | INTRAMUSCULAR | Status: AC
  Administered 2015-03-02: 1 mg via INTRAVENOUS
  Filled 2015-03-02: qty 1

## 2015-03-02 MED ORDER — HYDROCODONE-ACETAMINOPHEN 5-325 MG PO TABS: 1.0000 | ORAL_TABLET | Freq: Four times a day (QID) | ORAL | Status: AC | PRN

## 2015-03-02 MED ORDER — ONDANSETRON HCL 4 MG/2ML IJ SOLN
4.0000 mg | Freq: Once | INTRAMUSCULAR | Status: AC
  Administered 2015-03-02: 4 mg via INTRAVENOUS
  Filled 2015-03-02: qty 2

## 2015-03-02 NOTE — Discharge Instructions (Signed)
Dr. Jeral Fruit office will call you Monday to set up a time next week for you to get seen.  Keep arm elevated
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
## 2015-03-02 NOTE — ED Provider Notes (Signed)
CSN: [id number]     Arrival date & time 03/02/15  1343 History   First MD Initiated Contact with Patient 03/02/15 1430     Chief Complaint  Patient presents with  . Fall  . Arm Injury     (Consider location/radiation/quality/duration/timing/severity/associated sxs/prior Treatment) Patient is a 60 y.o. female presenting with fall and arm injury. The history is provided by the patient ([place] fell on her right hand and right shoulder. She did not hit her head).  Fall This is a new problem. The current episode started 6 to 12 hours ago. The problem occurs rarely. The problem has been resolved. Pertinent negatives include no chest pain, no abdominal pain and no headaches. Nothing aggravates the symptoms. Nothing relieves the symptoms.  Arm Injury Associated symptoms: no back pain and no fatigue     History reviewed. No pertinent past medical history. Past Surgical History  Procedure Laterality Date  . Breast surgery     Family History  Problem Relation Age of Onset  . Heart disease Mother   . Hyperlipidemia Mother   . Hyperlipidemia Father    Social History  Substance Use Topics  . Smoking status: Current Every Day Smoker -- 1.00 packs/day    Types: Cigarettes    Start date: 02/10/1969  . Smokeless tobacco: Never Used     Comment: pt not currently ready to quit  . Alcohol Use: No   OB History    No data available     Review of Systems  Constitutional: Negative for appetite change and fatigue.  HENT: Negative for congestion, ear discharge and sinus pressure.   Eyes: Negative for discharge.  Respiratory: Negative for cough.   Cardiovascular: Negative for chest pain.  Gastrointestinal: Negative for abdominal pain and diarrhea.  Genitourinary: Negative for frequency and hematuria.  Musculoskeletal: Negative for back pain.       Right wrist and right shoulder pain  Skin: Negative for rash.  Neurological: Negative for seizures and headaches.  Psychiatric/Behavioral:  Negative for hallucinations.      Allergies  Review of patient's allergies indicates no known allergies.  Home Medications   Prior to Admission medications   Medication Sig Start Date End Date Taking? Authorizing Provider  famotidine (PEPCID) 20 MG tablet take 20 mg every day as needed for acid reflux 12/23/14  Yes Historical Provider, MD  hydrochlorothiazide (HYDRODIURIL) 25 MG tablet Take 25 mg by mouth daily.   Yes Historical Provider, MD  lisinopril (PRINIVIL,ZESTRIL) 10 MG tablet Take 10 mg by mouth daily. 02/17/15  Yes Historical Provider, MD  meloxicam (MOBIC) 15 MG tablet Take 1 tablet (15 mg total) by mouth daily. 02/16/15  Yes Mancel Bale, PA-C  traMADol (ULTRAM) 50 MG tablet Take 1 tablet (50 mg total) by mouth every 8 (eight) hours as needed. 02/16/15  Yes Mancel Bale, PA-C  HYDROcodone-acetaminophen (NORCO/VICODIN) 5-325 MG tablet Take 1 tablet by mouth every 6 (six) hours as needed for moderate pain. 03/02/15   Milton Ferguson, MD   BP 155/91 mmHg  Pulse 65  Temp(Src) 98 F (36.7 C) (Oral)  Resp 18  SpO2 98% Physical Exam  Constitutional: She is oriented to person, place, and time. She appears well-developed.  HENT:  Head: Normocephalic.  Eyes: Conjunctivae and EOM are normal. No scleral icterus.  Neck: Neck supple. No thyromegaly present.  Cardiovascular: Normal rate and regular rhythm.  Exam reveals no gallop and no friction rub.   No murmur heard. Pulmonary/Chest: No stridor. She has no wheezes. She  has no rales. She exhibits no tenderness.  Abdominal: She exhibits no distension. There is no tenderness. There is no rebound.  Musculoskeletal: She exhibits no edema.  Patient has tenderness in right shoulder with palpation and movement. Patient also has tenderness and swelling to right wrist neurovascular exam is normal  Lymphadenopathy:    She has no cervical adenopathy.  Neurological: She is oriented to person, place, and time. She exhibits normal muscle tone.  Coordination normal.  Skin: No rash noted. No erythema.  Psychiatric: She has a normal mood and affect. Her behavior is normal.    ED Course  Procedures (including critical care time) Labs Review Labs Reviewed - No data to display  Imaging Review Dg Shoulder Right  03/02/2015  CLINICAL DATA:  Pain following fall EXAM: RIGHT SHOULDER - 2+ VIEW COMPARISON:  None. FINDINGS: Frontal and Y scapular images were obtained. There is a comminuted fracture of the proximal humeral metaphysis with impaction at the fracture site. Fracture fragments are in overall near anatomic alignment. No other fractures. No dislocation. There is slight generalized joint space narrowing. No erosive change. IMPRESSION: Comminuted fracture proximal humeral metaphysis. Mild impaction at fracture site. No dislocation. Mild generalized osteoarthritic change. Electronically Signed   By: Lowella Grip III M.D.   On: 03/02/2015 15:07   Dg Elbow Complete Right  03/02/2015  CLINICAL DATA:  Pain status post fall. EXAM: RIGHT ELBOW - COMPLETE 3+ VIEW COMPARISON:  None. FINDINGS: There is no evidence of fracture, dislocation, or joint effusion. There is no evidence of arthropathy or other focal bone abnormality. Soft tissues are unremarkable. IMPRESSION: Negative. Electronically Signed   By: Fidela Salisbury M.D.   On: 03/02/2015 15:05   Dg Wrist Complete Right  03/02/2015  CLINICAL DATA:  Fall.  Wrist pain. EXAM: RIGHT WRIST - COMPLETE 3+ VIEW COMPARISON:  None. FINDINGS: The comminuted fracture is present in the distal radial metaphysis. No definite intra-articular extension is present. There is dorsal angulation of 10-15 degrees. Partial displacement measures 5 mm. An ulnar styloid fracture is also present. The carpal bones are intact. IMPRESSION: 1. Comminuted distal radial metaphysis seal fracture with dorsal angulation and displacement. 2. Ulnar styloid fracture. Electronically Signed   By: San Morelle M.D.   On:  03/02/2015 15:05   I have personally reviewed and evaluated these images and lab results as part of my medical decision-making.   EKG Interpretation None      MDM   Final diagnoses:  Fall, initial encounter    Patient with wrist fracture minimal angulation and right impacted humerus shoulder fracture. She is to follow-up Dr. Burney Gauze next week. Patient given a splint sling and pain medicine    Milton Ferguson, MD 03/02/15 1616

## 2015-03-02 NOTE — ED Notes (Signed)
Upon assessment pt states "my arm hurts from my shoulder, to my elbow, to my wrist."

## 2015-03-05 DIAGNOSIS — S42291A Other displaced fracture of upper end of right humerus, initial encounter for closed fracture: Secondary | ICD-10-CM | POA: Diagnosis not present

## 2015-03-05 DIAGNOSIS — S52571A Other intraarticular fracture of lower end of right radius, initial encounter for closed fracture: Secondary | ICD-10-CM | POA: Diagnosis not present

## 2015-03-07 DIAGNOSIS — Y929 Unspecified place or not applicable: Secondary | ICD-10-CM | POA: Diagnosis not present

## 2015-03-07 DIAGNOSIS — S52571A Other intraarticular fracture of lower end of right radius, initial encounter for closed fracture: Secondary | ICD-10-CM | POA: Diagnosis not present

## 2015-03-07 DIAGNOSIS — Y939 Activity, unspecified: Secondary | ICD-10-CM | POA: Diagnosis not present

## 2015-03-07 DIAGNOSIS — Y999 Unspecified external cause status: Secondary | ICD-10-CM | POA: Diagnosis not present

## 2015-03-07 DIAGNOSIS — S42291A Other displaced fracture of upper end of right humerus, initial encounter for closed fracture: Secondary | ICD-10-CM | POA: Diagnosis not present

## 2015-03-07 DIAGNOSIS — G8918 Other acute postprocedural pain: Secondary | ICD-10-CM | POA: Diagnosis not present

## 2015-03-09 ENCOUNTER — Ambulatory Visit: Payer: Self-pay

## 2015-03-12 DIAGNOSIS — S42291A Other displaced fracture of upper end of right humerus, initial encounter for closed fracture: Secondary | ICD-10-CM | POA: Diagnosis not present

## 2015-03-12 DIAGNOSIS — S52571D Other intraarticular fracture of lower end of right radius, subsequent encounter for closed fracture with routine healing: Secondary | ICD-10-CM | POA: Diagnosis not present

## 2015-03-12 DIAGNOSIS — S52571A Other intraarticular fracture of lower end of right radius, initial encounter for closed fracture: Secondary | ICD-10-CM | POA: Diagnosis not present

## 2015-03-27 DIAGNOSIS — S52571D Other intraarticular fracture of lower end of right radius, subsequent encounter for closed fracture with routine healing: Secondary | ICD-10-CM | POA: Diagnosis not present

## 2015-03-27 DIAGNOSIS — S42291D Other displaced fracture of upper end of right humerus, subsequent encounter for fracture with routine healing: Secondary | ICD-10-CM | POA: Diagnosis not present

## 2015-04-17 DIAGNOSIS — S52571A Other intraarticular fracture of lower end of right radius, initial encounter for closed fracture: Secondary | ICD-10-CM | POA: Diagnosis not present

## 2015-04-17 DIAGNOSIS — S42291D Other displaced fracture of upper end of right humerus, subsequent encounter for fracture with routine healing: Secondary | ICD-10-CM | POA: Diagnosis not present

## 2015-04-24 DIAGNOSIS — I10 Essential (primary) hypertension: Secondary | ICD-10-CM | POA: Diagnosis not present

## 2015-04-24 DIAGNOSIS — Z6824 Body mass index (BMI) 24.0-24.9, adult: Secondary | ICD-10-CM | POA: Diagnosis not present

## 2015-04-24 DIAGNOSIS — M25531 Pain in right wrist: Secondary | ICD-10-CM | POA: Diagnosis not present

## 2015-05-01 ENCOUNTER — Other Ambulatory Visit: Payer: Self-pay

## 2015-05-08 DIAGNOSIS — S42291D Other displaced fracture of upper end of right humerus, subsequent encounter for fracture with routine healing: Secondary | ICD-10-CM | POA: Diagnosis not present

## 2015-05-08 DIAGNOSIS — S52571D Other intraarticular fracture of lower end of right radius, subsequent encounter for closed fracture with routine healing: Secondary | ICD-10-CM | POA: Diagnosis not present

## 2015-05-15 ENCOUNTER — Encounter: Payer: Self-pay | Admitting: Internal Medicine

## 2015-05-15 ENCOUNTER — Ambulatory Visit (INDEPENDENT_AMBULATORY_CARE_PROVIDER_SITE_OTHER): Payer: Medicare Other | Admitting: Internal Medicine

## 2015-05-15 VITALS — BP 131/85 | HR 91 | Temp 97.6°F | Ht 67.0 in | Wt 151.0 lb

## 2015-05-15 DIAGNOSIS — K7400 Hepatic fibrosis, unspecified: Secondary | ICD-10-CM

## 2015-05-15 DIAGNOSIS — Z23 Encounter for immunization: Secondary | ICD-10-CM

## 2015-05-15 DIAGNOSIS — B182 Chronic viral hepatitis C: Secondary | ICD-10-CM

## 2015-05-15 NOTE — Addendum Note (Signed)
Addended by: Myrtis Hopping A on: 05/15/2015 12:08 PM   Modules accepted: Orders

## 2015-05-15 NOTE — Assessment & Plan Note (Signed)
SVR12 today to confirm cure.  Will let her know.

## 2015-05-15 NOTE — Progress Notes (Signed)
   Subjective:    Patient ID: Martha Novak, female    DOB: 18-Jun-1955, 60 y.o.   MRN: [id number]  HPI  Here for follow up of HCV.  Completed harvoni for genotype 1, 4 months ago. Getting hepatitis A and B vaccine.  F2/3 on elastography.  No new complaints.  Here for SVR12  Review of Systems  Constitutional: Negative for fatigue.  Gastrointestinal: Negative for nausea and diarrhea.  Skin: Negative for rash.  Neurological: Negative for headaches.       Objective:   Physical Exam  Constitutional: She appears well-developed and well-nourished. No distress.  Eyes: No scleral icterus.  Cardiovascular: Normal rate, regular rhythm and normal heart sounds.   No murmur heard. Pulmonary/Chest: Effort normal and breath sounds normal. No respiratory distress.  Skin: No rash noted.  Vitals reviewed.  Social History   Social History  . Marital Status: Legally Separated    Spouse Name: N/A  . Number of Children: N/A  . Years of Education: N/A   Occupational History  . Not on file.   Social History Main Topics  . Smoking status: Current Every Day Smoker -- 1.00 packs/day    Types: Cigarettes    Start date: 02/10/1969  . Smokeless tobacco: Never Used     Comment: pt not currently ready to quit  . Alcohol Use: No  . Drug Use: No  . Sexual Activity:    Partners: Male     Comment: pt declined condoms   Other Topics Concern  . Not on file   Social History Narrative   Confirmed again that she does not drink.       Assessment & Plan:

## 2015-05-15 NOTE — Addendum Note (Signed)
Addended by: Thayer Headings on: 05/15/2015 11:59 AM   Modules accepted: Orders

## 2015-05-15 NOTE — Assessment & Plan Note (Signed)
Repeat elastography in about 6 months.

## 2015-06-26 DIAGNOSIS — S42291D Other displaced fracture of upper end of right humerus, subsequent encounter for fracture with routine healing: Secondary | ICD-10-CM | POA: Diagnosis not present

## 2015-06-26 DIAGNOSIS — S52571A Other intraarticular fracture of lower end of right radius, initial encounter for closed fracture: Secondary | ICD-10-CM | POA: Diagnosis not present

## 2015-06-26 DIAGNOSIS — S42291A Other displaced fracture of upper end of right humerus, initial encounter for closed fracture: Secondary | ICD-10-CM | POA: Diagnosis not present

## 2015-06-26 DIAGNOSIS — S52571D Other intraarticular fracture of lower end of right radius, subsequent encounter for closed fracture with routine healing: Secondary | ICD-10-CM | POA: Diagnosis not present

## 2015-07-16 DIAGNOSIS — K219 Gastro-esophageal reflux disease without esophagitis: Secondary | ICD-10-CM | POA: Diagnosis not present

## 2015-07-16 DIAGNOSIS — R079 Chest pain, unspecified: Secondary | ICD-10-CM | POA: Diagnosis not present

## 2015-07-30 ENCOUNTER — Other Ambulatory Visit: Payer: Self-pay

## 2015-07-31 DIAGNOSIS — S42291D Other displaced fracture of upper end of right humerus, subsequent encounter for fracture with routine healing: Secondary | ICD-10-CM | POA: Diagnosis not present

## 2015-08-01 DIAGNOSIS — N390 Urinary tract infection, site not specified: Secondary | ICD-10-CM | POA: Diagnosis not present

## 2015-08-01 DIAGNOSIS — Z6824 Body mass index (BMI) 24.0-24.9, adult: Secondary | ICD-10-CM | POA: Diagnosis not present

## 2015-08-01 DIAGNOSIS — I10 Essential (primary) hypertension: Secondary | ICD-10-CM | POA: Diagnosis not present

## 2015-08-15 DIAGNOSIS — K219 Gastro-esophageal reflux disease without esophagitis: Secondary | ICD-10-CM | POA: Diagnosis not present

## 2015-08-17 ENCOUNTER — Ambulatory Visit
Admission: RE | Admit: 2015-08-17 | Discharge: 2015-08-17 | Disposition: A | Discharge status: Home / Self Care | Payer: Medicare Other | Source: Ambulatory Visit

## 2015-08-17 DIAGNOSIS — Z1231 Encounter for screening mammogram for malignant neoplasm of breast: Secondary | ICD-10-CM | POA: Diagnosis not present

## 2015-09-10 ENCOUNTER — Other Ambulatory Visit: Payer: Medicare Other

## 2015-09-10 DIAGNOSIS — B182 Chronic viral hepatitis C: Secondary | ICD-10-CM | POA: Diagnosis not present

## 2015-09-11 LAB — HEPATITIS C RNA QUANTITATIVE: HCV QUANT: NOT DETECTED [IU]/mL (ref <15)

## 2015-09-13 ENCOUNTER — Ambulatory Visit: Payer: Self-pay | Admitting: Internal Medicine

## 2015-09-24 ENCOUNTER — Ambulatory Visit: Payer: Self-pay | Admitting: Internal Medicine

## 2015-10-30 DIAGNOSIS — N390 Urinary tract infection, site not specified: Secondary | ICD-10-CM | POA: Diagnosis not present

## 2015-10-31 DIAGNOSIS — I10 Essential (primary) hypertension: Secondary | ICD-10-CM | POA: Diagnosis not present

## 2015-10-31 DIAGNOSIS — Z0000 Encounter for general adult medical examination without abnormal findings: Secondary | ICD-10-CM | POA: Diagnosis not present

## 2015-10-31 DIAGNOSIS — N390 Urinary tract infection, site not specified: Secondary | ICD-10-CM | POA: Diagnosis not present

## 2015-11-13 ENCOUNTER — Ambulatory Visit (HOSPITAL_COMMUNITY): Payer: Medicare Other

## 2015-11-20 ENCOUNTER — Encounter (HOSPITAL_COMMUNITY): Payer: Self-pay

## 2015-11-20 ENCOUNTER — Ambulatory Visit (HOSPITAL_COMMUNITY)
Admission: RE | Admit: 2015-11-20 | Discharge: 2015-11-20 | Disposition: A | Discharge status: Home / Self Care | Payer: Medicare Other | Source: Ambulatory Visit | Attending: Internal Medicine | Admitting: Internal Medicine

## 2015-11-20 DIAGNOSIS — K7400 Hepatic fibrosis, unspecified: Secondary | ICD-10-CM

## 2015-12-11 DIAGNOSIS — L720 Epidermal cyst: Secondary | ICD-10-CM | POA: Diagnosis not present

## 2016-01-01 ENCOUNTER — Ambulatory Visit (HOSPITAL_COMMUNITY): Payer: Medicare Other

## 2016-01-30 DIAGNOSIS — I10 Essential (primary) hypertension: Secondary | ICD-10-CM | POA: Diagnosis not present

## 2016-01-30 DIAGNOSIS — L720 Epidermal cyst: Secondary | ICD-10-CM | POA: Diagnosis not present

## 2016-01-30 DIAGNOSIS — Z23 Encounter for immunization: Secondary | ICD-10-CM | POA: Diagnosis not present

## 2016-01-30 DIAGNOSIS — R079 Chest pain, unspecified: Secondary | ICD-10-CM | POA: Diagnosis not present

## 2016-03-31 DIAGNOSIS — I10 Essential (primary) hypertension: Secondary | ICD-10-CM | POA: Diagnosis not present

## 2016-07-01 DIAGNOSIS — I10 Essential (primary) hypertension: Secondary | ICD-10-CM | POA: Diagnosis not present

## 2016-07-01 DIAGNOSIS — K219 Gastro-esophageal reflux disease without esophagitis: Secondary | ICD-10-CM | POA: Diagnosis not present

## 2016-07-09 ENCOUNTER — Other Ambulatory Visit: Payer: Self-pay | Admitting: Family Medicine

## 2016-07-09 DIAGNOSIS — Z1231 Encounter for screening mammogram for malignant neoplasm of breast: Secondary | ICD-10-CM

## 2016-08-18 ENCOUNTER — Ambulatory Visit
Admission: RE | Admit: 2016-08-18 | Discharge: 2016-08-18 | Disposition: A | Discharge status: Home / Self Care | Payer: Medicare Other | Source: Ambulatory Visit | Attending: Family Medicine | Admitting: Family Medicine

## 2016-08-18 DIAGNOSIS — Z1231 Encounter for screening mammogram for malignant neoplasm of breast: Secondary | ICD-10-CM | POA: Diagnosis not present

## 2016-09-30 ENCOUNTER — Ambulatory Visit
Admission: RE | Admit: 2016-09-30 | Discharge: 2016-09-30 | Disposition: A | Discharge status: Home / Self Care | Payer: Medicare Other | Source: Ambulatory Visit | Attending: Family Medicine | Admitting: Family Medicine

## 2016-09-30 ENCOUNTER — Other Ambulatory Visit: Payer: Self-pay | Admitting: Family Medicine

## 2016-09-30 DIAGNOSIS — M545 Low back pain: Secondary | ICD-10-CM | POA: Diagnosis not present

## 2016-09-30 DIAGNOSIS — G8929 Other chronic pain: Secondary | ICD-10-CM

## 2016-09-30 DIAGNOSIS — I10 Essential (primary) hypertension: Secondary | ICD-10-CM | POA: Diagnosis not present

## 2016-10-08 ENCOUNTER — Encounter: Payer: Self-pay | Admitting: Internal Medicine

## 2016-11-20 ENCOUNTER — Ambulatory Visit (AMBULATORY_SURGERY_CENTER): Payer: Self-pay | Admitting: *Deleted

## 2016-11-20 VITALS — Ht 67.0 in | Wt 157.6 lb

## 2016-11-20 DIAGNOSIS — Z1211 Encounter for screening for malignant neoplasm of colon: Secondary | ICD-10-CM

## 2016-11-20 MED ORDER — NA SULFATE-K SULFATE-MG SULF 17.5-3.13-1.6 GM/177ML PO SOLN: 1.0000 [IU] | Freq: Once | ORAL | 0 refills | Status: AC

## 2016-11-20 NOTE — Progress Notes (Signed)
No egg or soy allergy known to patient  No issues with past sedation with any surgeries  or procedures, no intubation problems  No diet pills per patient No home 02 use per patient  No blood thinners per patient  Pt denies issues with constipation  No A fib or A flutter  EMMI video sent to pt's e mail -- no email   

## 2016-12-03 ENCOUNTER — Telehealth: Payer: Self-pay | Admitting: Internal Medicine

## 2016-12-03 ENCOUNTER — Encounter: Payer: Self-pay | Admitting: *Deleted

## 2016-12-03 NOTE — Telephone Encounter (Signed)
Lacretia Leigh, pt's sister calling. She states pt, Martha Novak drank one bottle of suprep 11-20-16 the day of her PV when she got home and then drank the 2nd bottle this morning at 6 am.  Asking if pt should have done that.  Instructed Ann no, this was no correct procedure.  Instructed ann she needs to come to [facility] 4th floor and get another suprep sample for pt.  Ann instructed pt should drink 1 bottle at 6 pm today, Wednesday and follow this with 2- 16 oz containers of water. Then she should repeat this at 6 am tomorrow 10-25, the exact procedure. Informed Ann pt should only hjave clear liquids today and she states she has ONLy had clears all day today. I printed the instructions and attached them to the sample suprep box for Ann as well. Encouraged and instructed to call back with any further questions.  Lenard Galloway RN    Samples of this drug were given to the patient, quantity suprep 1 kit, Lot Number [id number] exp 7-20  As directed

## 2016-12-04 ENCOUNTER — Ambulatory Visit (AMBULATORY_SURGERY_CENTER): Payer: Medicare Other | Admitting: Internal Medicine

## 2016-12-04 ENCOUNTER — Encounter: Payer: Self-pay | Admitting: Internal Medicine

## 2016-12-04 VITALS — BP 119/65 | HR 56 | Temp 98.4°F | Resp 19 | Ht 67.0 in | Wt 151.0 lb

## 2016-12-04 DIAGNOSIS — Z1211 Encounter for screening for malignant neoplasm of colon: Secondary | ICD-10-CM | POA: Diagnosis present

## 2016-12-04 DIAGNOSIS — K635 Polyp of colon: Secondary | ICD-10-CM

## 2016-12-04 DIAGNOSIS — Z1212 Encounter for screening for malignant neoplasm of rectum: Secondary | ICD-10-CM | POA: Diagnosis not present

## 2016-12-04 DIAGNOSIS — D122 Benign neoplasm of ascending colon: Secondary | ICD-10-CM

## 2016-12-04 DIAGNOSIS — D125 Benign neoplasm of sigmoid colon: Secondary | ICD-10-CM | POA: Diagnosis not present

## 2016-12-04 MED ORDER — SODIUM CHLORIDE 0.9 % IV SOLN: 500.0000 mL | INTRAVENOUS | Status: AC

## 2016-12-04 NOTE — Progress Notes (Signed)
To PACU, VSS. Report to [email or web address] 

## 2016-12-04 NOTE — Progress Notes (Signed)
Pt's states no medical or surgical changes since previsit or office visit. 

## 2016-12-04 NOTE — Progress Notes (Signed)
Called to room to assist during endoscopic procedure.  Patient ID and intended procedure confirmed with present staff. Received instructions for my participation in the procedure from the performing physician.  

## 2016-12-04 NOTE — Patient Instructions (Signed)
YOU HAD AN ENDOSCOPIC PROCEDURE TODAY AT THE [facility]:   Refer to the procedure report that was given to you for any specific questions about what was found during the examination.  If the procedure report does not answer your questions, please call your gastroenterologist to clarify.  If you requested that your care partner not be given the details of your procedure findings, then the procedure report has been included in a sealed envelope for you to review at your convenience later.  YOU SHOULD EXPECT: Some feelings of bloating in the abdomen. Passage of more gas than usual.  Walking can help get rid of the air that was put into your GI tract during the procedure and reduce the bloating. If you had a lower endoscopy (such as a colonoscopy or flexible sigmoidoscopy) you may notice spotting of blood in your stool or on the toilet paper. If you underwent a bowel prep for your procedure, you may not have a normal bowel movement for a few days.  Please Note:  You might notice some irritation and congestion in your nose or some drainage.  This is from the oxygen used during your procedure.  There is no need for concern and it should clear up in a day or so.  SYMPTOMS TO REPORT IMMEDIATELY:   Following lower endoscopy (colonoscopy or flexible sigmoidoscopy):  Excessive amounts of blood in the stool  Significant tenderness or worsening of abdominal pains  Swelling of the abdomen that is new, acute  Fever of 100F or higher    For urgent or emergent issues, a gastroenterologist can be reached at any hour by calling [phone].   DIET:  We do recommend a small meal at first, but then you may proceed to your regular diet.  Drink plenty of fluids but you should avoid alcoholic beverages for 24 hours.  ACTIVITY:  You should plan to take it easy for the rest of today and you should NOT DRIVE or use heavy machinery until tomorrow (because of the sedation medicines used during the test).     FOLLOW UP: Our staff will call the number listed on your records the next business day following your procedure to check on you and address any questions or concerns that you may have regarding the information given to you following your procedure. If we do not reach you, we will leave a message.  However, if you are feeling well and you are not experiencing any problems, there is no need to return our call.  We will assume that you have returned to your regular daily activities without incident.  If any biopsies were taken you will be contacted by phone or by letter within the next 1-3 weeks.  Please call us at [phone] if you have not heard about the biopsies in 3 weeks.    SIGNATURES/CONFIDENTIALITY: You and/or your care partner have signed paperwork which will be entered into your electronic medical record.  These signatures attest to the fact that that the information above on your After Visit Summary has been reviewed and is understood.  Full responsibility of the confidentiality of this discharge information lies with you and/or your care-partner.   Resume medications. Information given on polyps. 

## 2016-12-04 NOTE — Op Note (Signed)
[facility] Patient Name: Martha Novak Procedure Date: 12/04/2016 11:19 AM MRN: [id number] Endoscopist: Jerene Bears , MD Age: 61 Referring MD:  Date of Birth: 15-Sep-1955 Gender: Female Account #: [id number] Procedure:                Colonoscopy Indications:              Screening for colorectal malignant neoplasm, This                            is the patient's first colonoscopy Medicines:                Monitored Anesthesia Care Procedure:                Pre-Anesthesia Assessment:                           - Prior to the procedure, a History and Physical                            was performed, and patient medications and                            allergies were reviewed. The patient's tolerance of                            previous anesthesia was also reviewed. The risks                            and benefits of the procedure and the sedation                            options and risks were discussed with the patient.                            All questions were answered, and informed consent                            was obtained. Prior Anticoagulants: The patient has                            taken no previous anticoagulant or antiplatelet                            agents. ASA Grade Assessment: II - A patient with                            mild systemic disease. After reviewing the risks                            and benefits, the patient was deemed in                            satisfactory condition to undergo the procedure.  After obtaining informed consent, the colonoscope                            was passed under direct vision. Throughout the                            procedure, the patient's blood pressure, pulse, and                            oxygen saturations were monitored continuously. The                            Colonoscope was introduced through the anus and                            advanced to the the  cecum, identified by                            appendiceal orifice and ileocecal valve. The                            colonoscopy was performed without difficulty. The                            patient tolerated the procedure well. The quality                            of the bowel preparation was good. The ileocecal                            valve, appendiceal orifice, and rectum were                            photographed. Scope In: 11:24:42 AM Scope Out: 11:41:43 AM Scope Withdrawal Time: 0 hours 14 minutes 9 seconds  Total Procedure Duration: 0 hours 17 minutes 1 second  Findings:                 The digital rectal exam was normal.                           Two sessile polyps were found in the ascending                            colon. The polyps were 3 to 4 mm in size. These                            polyps were removed with a cold snare. Resection                            and retrieval were complete.                           A 6 mm polyp was found in the sigmoid colon. The  polyp was sessile. The polyp was removed with a                            cold snare. Resection and retrieval were complete.                           The retroflexed view of the distal rectum and anal                            verge was normal and showed no anal or rectal                            abnormalities. Complications:            No immediate complications. Estimated Blood Loss:     Estimated blood loss was minimal. Impression:               - Two 3 to 4 mm polyps in the ascending colon,                            removed with a cold snare. Resected and retrieved.                           - One 6 mm polyp in the sigmoid colon, removed with                            a cold snare. Resected and retrieved.                           - The distal rectum and anal verge are normal on                            retroflexion view. Recommendation:           - Patient has  a contact number available for                            emergencies. The signs and symptoms of potential                            delayed complications were discussed with the                            patient. Return to normal activities tomorrow.                            Written discharge instructions were provided to the                            patient.                           - Resume previous diet.                           -  Continue present medications.                           - Await pathology results.                           - Repeat colonoscopy is recommended. The                            colonoscopy date will be determined after pathology                            results from today's exam become available for                            review. Jerene Bears, MD 12/04/2016 11:45:36 AM This report has been signed electronically.

## 2016-12-05 ENCOUNTER — Telehealth: Payer: Self-pay

## 2016-12-05 NOTE — Telephone Encounter (Signed)
Called [phone] and left a messaged we tried to reach pt for a follow up call. maw

## 2016-12-05 NOTE — Telephone Encounter (Signed)
Left message on answering machine. 

## 2016-12-09 ENCOUNTER — Encounter: Payer: Self-pay | Admitting: Internal Medicine

## 2016-12-30 DIAGNOSIS — I10 Essential (primary) hypertension: Secondary | ICD-10-CM | POA: Diagnosis not present

## 2016-12-30 DIAGNOSIS — Z720 Tobacco use: Secondary | ICD-10-CM | POA: Diagnosis not present

## 2017-02-24 DIAGNOSIS — I10 Essential (primary) hypertension: Secondary | ICD-10-CM | POA: Diagnosis not present

## 2017-02-24 DIAGNOSIS — Z720 Tobacco use: Secondary | ICD-10-CM | POA: Diagnosis not present

## 2017-03-03 DIAGNOSIS — Z720 Tobacco use: Secondary | ICD-10-CM | POA: Diagnosis not present

## 2017-03-03 DIAGNOSIS — I10 Essential (primary) hypertension: Secondary | ICD-10-CM | POA: Diagnosis not present

## 2017-06-01 DIAGNOSIS — I10 Essential (primary) hypertension: Secondary | ICD-10-CM | POA: Diagnosis not present

## 2017-06-01 DIAGNOSIS — S0083XA Contusion of other part of head, initial encounter: Secondary | ICD-10-CM | POA: Diagnosis not present

## 2017-07-01 DIAGNOSIS — I10 Essential (primary) hypertension: Secondary | ICD-10-CM | POA: Diagnosis not present

## 2017-07-01 DIAGNOSIS — S0083XD Contusion of other part of head, subsequent encounter: Secondary | ICD-10-CM | POA: Diagnosis not present

## 2017-07-17 DIAGNOSIS — I469 Cardiac arrest, cause unspecified: Secondary | ICD-10-CM | POA: Diagnosis not present

## 2017-07-17 DIAGNOSIS — R402441 Other coma, without documented Glasgow coma scale score, or with partial score reported, in the field [EMT or ambulance]: Secondary | ICD-10-CM | POA: Diagnosis not present

## 2017-08-10 DIAGNOSIS — 419620001 Death: Secondary | SNOMED CT | POA: Diagnosis not present

## 2017-08-10 DEATH — deceased

## 2018-09-06 ENCOUNTER — Other Ambulatory Visit: Payer: Self-pay | Admitting: Internal Medicine

## 2019-05-04 IMAGING — CR DG LUMBAR SPINE COMPLETE 4+V
5 series · 5 of 5 positions shown · non-contrast
Comparison: None.

CLINICAL DATA: Chronic lumbar pain.

EXAM:
LUMBAR SPINE - COMPLETE 4+ VIEW

[t l-spine a.p.]
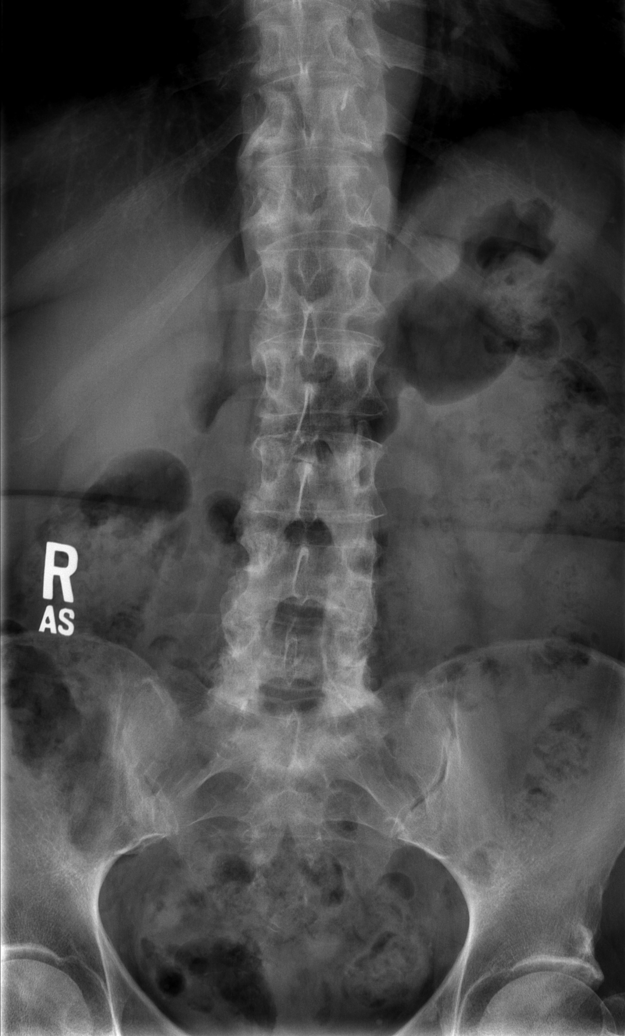

[t l-spine oblique exposure (1 of 2)]
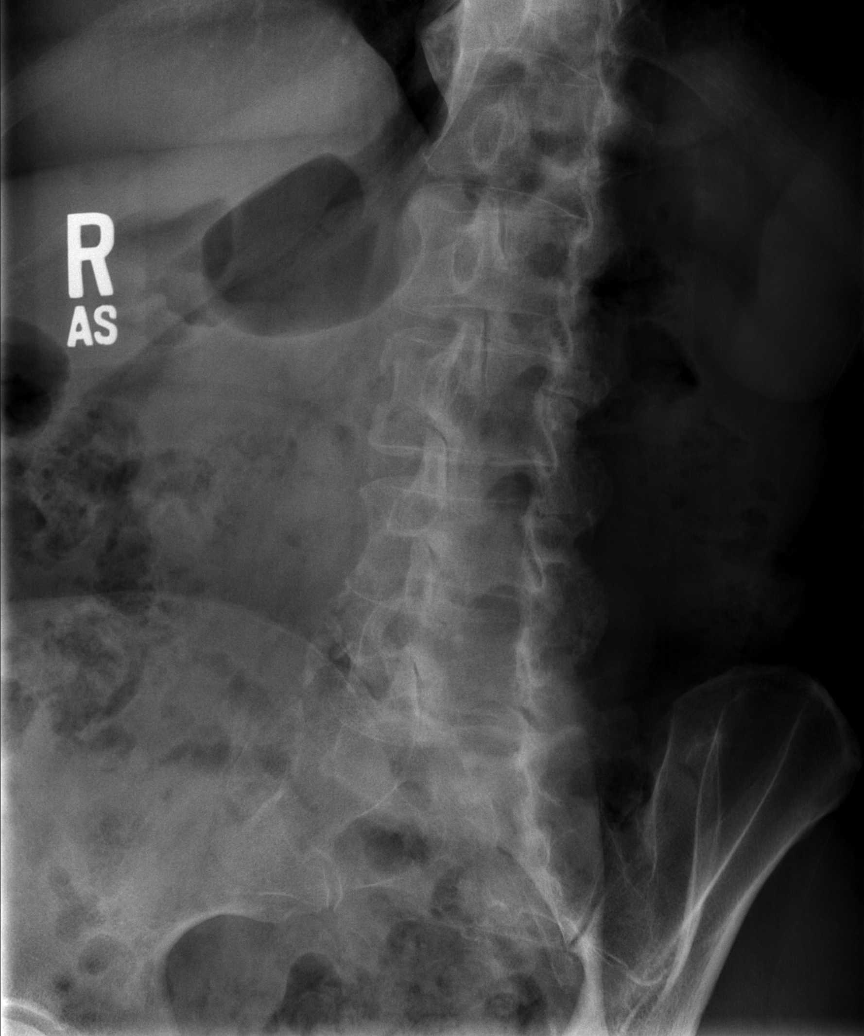

[t l-spine oblique exposure (2 of 2)]
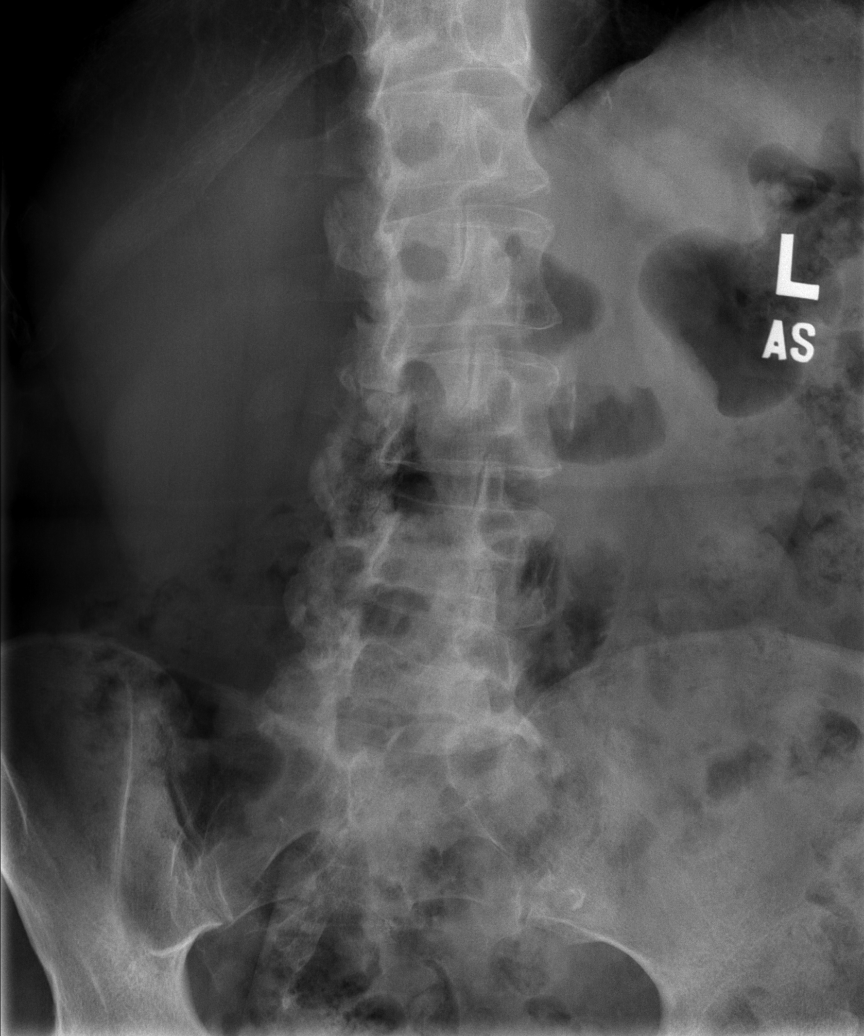

[t l-spine lat]
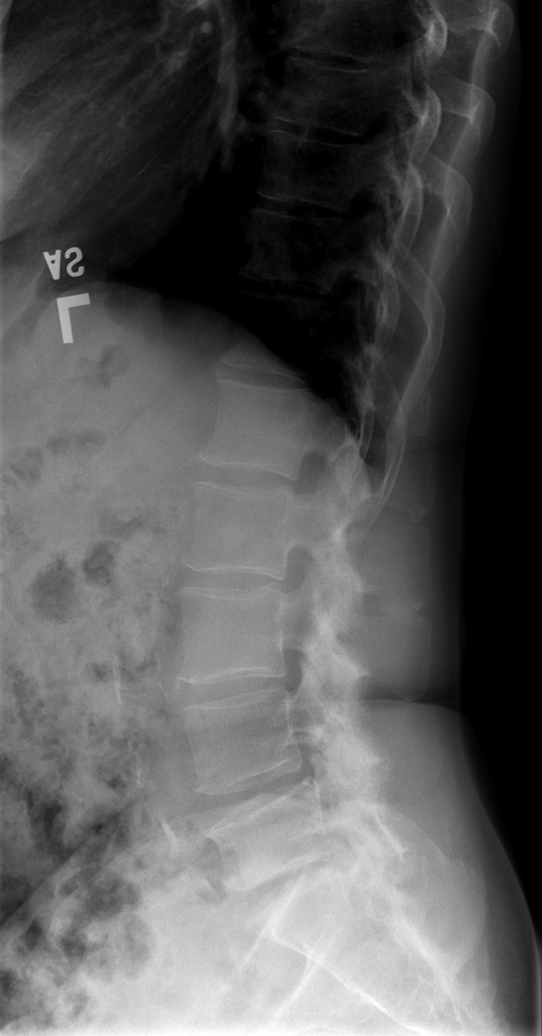

[t l-spine l5-s1 spot]
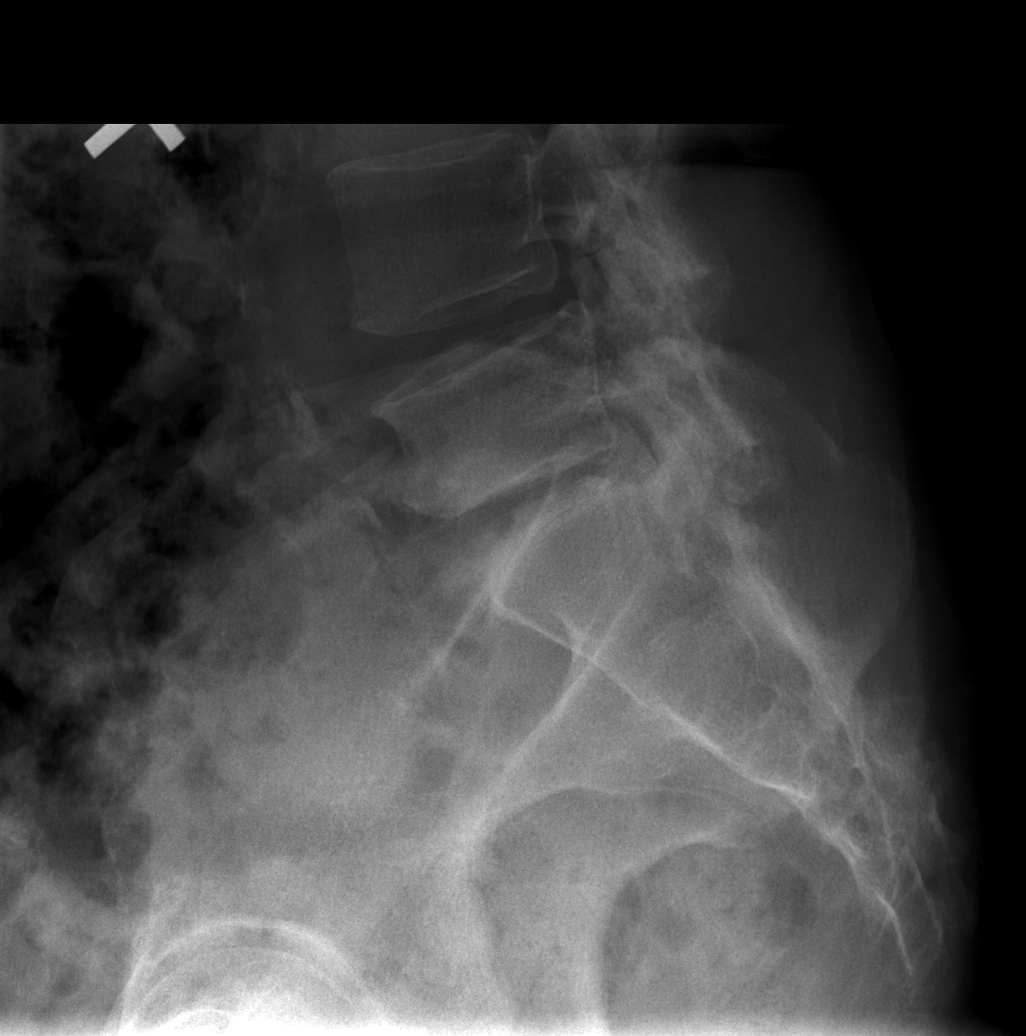

[5 of 5 positions shown; findings below may reference images not displayed]

FINDINGS: Mild anterior wedging of L5 is likely nonacute given the lack of an
injury and 6 years of pain. There is less than 10% loss of anterior
height. Lumbar vertebral bodies are otherwise normal in
configuration. There are degenerative changes most marked at L5-S1
is in the lower lumbar facets. No other acute abnormalities.
IMPRESSION: 1. Probable chronic mild wedging of L5. Recommend correlation to
exclude point tenderness in this region.
2. Degenerative changes most marked at L5-S1 and in the lower lumbar
facets.

## 2021-12-04 ENCOUNTER — Encounter: Payer: Self-pay | Admitting: Internal Medicine
